# Patient Record
Sex: Male | Born: 2000 | Race: Black or African American | Hispanic: No | Marital: Single | Smoking: Current every day smoker
Health system: Southern US, Community
[De-identification: ages and names within clinical notes are randomized; demographics above are authoritative.]

## PROBLEM LIST (undated history)

## (undated) DIAGNOSIS — W3400XA Accidental discharge from unspecified firearms or gun, initial encounter: Secondary | ICD-10-CM

---
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
## 2019-02-24 ENCOUNTER — Other Ambulatory Visit: Payer: Self-pay

## 2019-02-24 ENCOUNTER — Emergency Department
Admission: EM | Admit: 2019-02-24 | Discharge: 2019-02-25 | Disposition: A | Discharge status: Home / Self Care | Payer: Medicaid Other | Attending: Emergency Medicine | Admitting: Emergency Medicine

## 2019-02-24 ENCOUNTER — Emergency Department: Payer: Medicaid Other

## 2019-02-24 DIAGNOSIS — R519 Headache, unspecified: Secondary | ICD-10-CM | POA: Insufficient documentation

## 2019-02-24 DIAGNOSIS — S60512A Abrasion of left hand, initial encounter: Secondary | ICD-10-CM | POA: Diagnosis not present

## 2019-02-24 DIAGNOSIS — S8001XA Contusion of right knee, initial encounter: Secondary | ICD-10-CM

## 2019-02-24 DIAGNOSIS — Y9389 Activity, other specified: Secondary | ICD-10-CM | POA: Diagnosis not present

## 2019-02-24 DIAGNOSIS — S60222A Contusion of left hand, initial encounter: Secondary | ICD-10-CM

## 2019-02-24 DIAGNOSIS — S80211A Abrasion, right knee, initial encounter: Secondary | ICD-10-CM | POA: Insufficient documentation

## 2019-02-24 DIAGNOSIS — F17210 Nicotine dependence, cigarettes, uncomplicated: Secondary | ICD-10-CM | POA: Diagnosis not present

## 2019-02-24 DIAGNOSIS — T07XXXA Unspecified multiple injuries, initial encounter: Secondary | ICD-10-CM

## 2019-02-24 DIAGNOSIS — M7918 Myalgia, other site: Secondary | ICD-10-CM

## 2019-02-24 DIAGNOSIS — Y92410 Unspecified street and highway as the place of occurrence of the external cause: Secondary | ICD-10-CM | POA: Diagnosis not present

## 2019-02-24 DIAGNOSIS — S8991XA Unspecified injury of right lower leg, initial encounter: Secondary | ICD-10-CM | POA: Diagnosis present

## 2019-02-24 DIAGNOSIS — Y999 Unspecified external cause status: Secondary | ICD-10-CM | POA: Insufficient documentation

## 2019-02-24 MED ORDER — BACITRACIN ZINC 500 UNIT/GM EX OINT
TOPICAL_OINTMENT | Freq: Two times a day (BID) | CUTANEOUS | Status: DC
  Administered 2019-02-25: 1 via TOPICAL
  Filled 2019-02-24: qty 0.9
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
## 2019-02-24 NOTE — ED Notes (Signed)
Report given to Rebecca RN.

## 2019-02-24 NOTE — Discharge Instructions (Addendum)
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
## 2022-06-16 ENCOUNTER — Other Ambulatory Visit: Payer: Self-pay

## 2022-06-16 ENCOUNTER — Encounter (HOSPITAL_COMMUNITY): Payer: Self-pay | Admitting: Emergency Medicine

## 2022-06-16 ENCOUNTER — Emergency Department (HOSPITAL_COMMUNITY): Payer: Medicaid Other

## 2022-06-16 ENCOUNTER — Emergency Department (HOSPITAL_COMMUNITY)
Admission: EM | Admit: 2022-06-16 | Discharge: 2022-06-16 | Disposition: A | Discharge status: Home / Self Care | Payer: Medicaid Other | Attending: Emergency Medicine | Admitting: Emergency Medicine

## 2022-06-16 DIAGNOSIS — M25552 Pain in left hip: Secondary | ICD-10-CM | POA: Diagnosis present

## 2022-06-16 DIAGNOSIS — S71002A Unspecified open wound, left hip, initial encounter: Secondary | ICD-10-CM | POA: Insufficient documentation

## 2022-06-16 DIAGNOSIS — Z23 Encounter for immunization: Secondary | ICD-10-CM | POA: Diagnosis not present

## 2022-06-16 DIAGNOSIS — W3400XA Accidental discharge from unspecified firearms or gun, initial encounter: Secondary | ICD-10-CM | POA: Insufficient documentation

## 2022-06-16 LAB — I-STAT CHEM 8, ED
BUN: 18 mg/dL (ref 6–20)
Calcium, Ion: 1.14 mmol/L — ABNORMAL LOW (ref 1.15–1.40)
Chloride: 109 mmol/L (ref 98–111)
Creatinine, Ser: 1.2 mg/dL (ref 0.61–1.24)
Glucose, Bld: 156 mg/dL — ABNORMAL HIGH (ref 70–99)
HCT: 41 % (ref 39.0–52.0)
Hemoglobin: 13.9 g/dL (ref 13.0–17.0)
Potassium: 3.3 mmol/L — ABNORMAL LOW (ref 3.5–5.1)
Sodium: 144 mmol/L (ref 135–145)
TCO2: 20 mmol/L — ABNORMAL LOW (ref 22–32)

## 2022-06-16 LAB — CBC
HCT: 40.9 % (ref 39.0–52.0)
Hemoglobin: 13.5 g/dL (ref 13.0–17.0)
MCH: 28.7 pg (ref 26.0–34.0)
MCHC: 33 g/dL (ref 30.0–36.0)
MCV: 86.8 fL (ref 80.0–100.0)
Platelets: 196 10*3/uL (ref 150–400)
RBC: 4.71 MIL/uL (ref 4.22–5.81)
RDW: 12.4 % (ref 11.5–15.5)
WBC: 9 10*3/uL (ref 4.0–10.5)
nRBC: 0 % (ref 0.0–0.2)

## 2022-06-16 LAB — PROTIME-INR
INR: 1 (ref 0.8–1.2)
Prothrombin Time: 12.8 seconds (ref 11.4–15.2)

## 2022-06-16 LAB — COMPREHENSIVE METABOLIC PANEL
ALT: 18 U/L (ref 0–44)
AST: 28 U/L (ref 15–41)
Albumin: 4.1 g/dL (ref 3.5–5.0)
Alkaline Phosphatase: 66 U/L (ref 38–126)
Anion gap: 14 (ref 5–15)
BUN: 18 mg/dL (ref 6–20)
CO2: 19 mmol/L — ABNORMAL LOW (ref 22–32)
Calcium: 8.9 mg/dL (ref 8.9–10.3)
Chloride: 108 mmol/L (ref 98–111)
Creatinine, Ser: 1.14 mg/dL (ref 0.61–1.24)
GFR, Estimated: >60 mL/min (ref >60)
Glucose, Bld: 157 mg/dL — ABNORMAL HIGH (ref 70–99)
Potassium: 3.4 mmol/L — ABNORMAL LOW (ref 3.5–5.1)
Sodium: 141 mmol/L (ref 135–145)
Total Bilirubin: 0.8 mg/dL (ref 0.3–1.2)
Total Protein: 7.2 g/dL (ref 6.5–8.1)

## 2022-06-16 LAB — ETHANOL: Alcohol, Ethyl (B): 65 mg/dL — ABNORMAL HIGH (ref <10)

## 2022-06-16 MED ORDER — OXYCODONE-ACETAMINOPHEN 5-325 MG PO TABS: 1.0000 | ORAL_TABLET | Freq: Four times a day (QID) | ORAL | 0 refills | Status: DC | PRN

## 2022-06-16 MED ORDER — IOHEXOL 350 MG/ML SOLN
75.0000 mL | Freq: Once | INTRAVENOUS | Status: AC | PRN
  Administered 2022-06-16: 75 mL via INTRAVENOUS

## 2022-06-16 MED ORDER — TETANUS-DIPHTH-ACELL PERTUSSIS 5-2.5-18.5 LF-MCG/0.5 IM SUSY
0.5000 mL | PREFILLED_SYRINGE | Freq: Once | INTRAMUSCULAR | Status: AC
  Administered 2022-06-16: 0.5 mL via INTRAMUSCULAR
  Filled 2022-06-16: qty 0.5

## 2022-06-16 MED ORDER — CEPHALEXIN 500 MG PO CAPS: 500.0000 mg | ORAL_CAPSULE | Freq: Four times a day (QID) | ORAL | 0 refills | Status: DC

## 2022-06-16 MED ORDER — OXYCODONE-ACETAMINOPHEN 5-325 MG PO TABS
1.0000 | ORAL_TABLET | Freq: Once | ORAL | Status: AC
  Administered 2022-06-16: 1 via ORAL
  Filled 2022-06-16: qty 1

## 2022-06-16 MED ORDER — CEFAZOLIN SODIUM-DEXTROSE 1-4 GM/50ML-% IV SOLN
1.0000 g | Freq: Once | INTRAVENOUS | Status: AC
  Administered 2022-06-16: 1 g via INTRAVENOUS
  Filled 2022-06-16: qty 50

## 2022-06-16 MED ORDER — FENTANYL CITRATE PF 50 MCG/ML IJ SOSY
50.0000 ug | PREFILLED_SYRINGE | Freq: Once | INTRAMUSCULAR | Status: AC
  Administered 2022-06-16: 50 ug via INTRAVENOUS
  Filled 2022-06-16: qty 1

## 2022-06-16 NOTE — ED Notes (Signed)
Pt was helped with attempted ambulation, was unable to bear weight on the left leg due to pain, pt stated he felt a shooting pain down to his knee". MD was made aware

## 2022-06-16 NOTE — ED Notes (Signed)
[place] [employer] at bedside

## 2022-06-16 NOTE — ED Notes (Addendum)
MD Ralene Bathe at bedside. TRN Autumn at bedside.
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
## 2022-06-16 NOTE — Discharge Instructions (Signed)
Use the crutches for weightbearing as tolerated.  Please call the orthopedics office for a follow-up appointment.  You may take ibuprofen, available over-the-counter according to label instructions for pain.

## 2022-06-16 NOTE — Progress Notes (Signed)
   06/16/22 0200  Spiritual Encounters  Type of Visit Initial  Care provided to: Patient  Referral source Trauma page  Reason for visit Trauma  OnCall Visit Yes  Spiritual Framework  Presenting Themes Impactful experiences and emotions  Interventions  Spiritual Care Interventions Made Established relationship of care and support;Encouragement  Intervention Outcomes  Outcomes Connection to spiritual care;Declined chaplain visit   Chaplain responded to Trauma Code, pt was alert but declined chaplain visit. He had an optimistic outlook and stated he was okay.

## 2022-06-16 NOTE — ED Notes (Signed)
Pt voided 316m of urine, with no red or blood tinged color noted Was given a ginger ale, and ice water
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
## 2022-06-16 NOTE — Progress Notes (Signed)
Orthopedic Tech Progress Note Patient Details:  Frank Miller 2001-04-04 [id number]  Ortho Devices Type of Ortho Device: Crutches Ortho Device/Splint Interventions: Ordered, Application, Adjustment   Post Interventions Patient Tolerated: Well Instructions Provided: Adjustment of device, Care of device, Poper ambulation with device  Sreekar Broyhill L Angelisa Winthrop 06/16/2022, 6:39 AM

## 2022-06-16 NOTE — ED Notes (Signed)
X-ray at bedside
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
## 2022-06-16 NOTE — ED Notes (Signed)
Girlfriend asked to be called with an update on whether or not patient will be staying in the hospital, Girlfriend is [place], [phone]

## 2022-09-18 ENCOUNTER — Encounter (HOSPITAL_COMMUNITY): Payer: Self-pay | Admitting: *Deleted

## 2022-09-18 ENCOUNTER — Ambulatory Visit (HOSPITAL_COMMUNITY)
Admission: EM | Admit: 2022-09-18 | Discharge: 2022-09-18 | Disposition: A | Discharge status: Home / Self Care | Payer: BLUE CROSS/BLUE SHIELD | Attending: Physician Assistant | Admitting: Physician Assistant

## 2022-09-18 DIAGNOSIS — R0981 Nasal congestion: Secondary | ICD-10-CM | POA: Diagnosis present

## 2022-09-18 DIAGNOSIS — J029 Acute pharyngitis, unspecified: Secondary | ICD-10-CM | POA: Diagnosis present

## 2022-09-18 DIAGNOSIS — Z1152 Encounter for screening for COVID-19: Secondary | ICD-10-CM | POA: Insufficient documentation

## 2022-09-18 DIAGNOSIS — J069 Acute upper respiratory infection, unspecified: Secondary | ICD-10-CM | POA: Diagnosis present

## 2022-09-18 HISTORY — DX: Accidental discharge from unspecified firearms or gun, initial encounter: W34.00XA

## 2022-09-18 LAB — POCT RAPID STREP A (OFFICE): Rapid Strep A Screen: NEGATIVE

## 2022-09-18 MED ORDER — FLUTICASONE PROPIONATE 50 MCG/ACT NA SUSP: 1.0000 | Freq: Every day | NASAL | 0 refills | Status: AC

## 2022-09-18 MED ORDER — IBUPROFEN 600 MG PO TABS: 600.0000 mg | ORAL_TABLET | Freq: Four times a day (QID) | ORAL | 0 refills | Status: AC | PRN

## 2022-09-18 MED ORDER — LIDOCAINE VISCOUS HCL 2 % MT SOLN: 15.0000 mL | Freq: Four times a day (QID) | OROMUCOSAL | 0 refills | Status: AC | PRN
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
## 2022-09-18 NOTE — Discharge Instructions (Signed)
Your strep test was negative.  We will contact you if you are positive for COVID.  Please monitor your MyChart for these results.  Take ibuprofen 600 mg up to every 6 hours as needed for pain and fever.  Do not take additional NSAIDs with this medication including aspirin, ibuprofen/Advil, naproxen/Aleve.  You can use acetaminophen/Tylenol for breakthrough pain.  Use fluticasone to help with your nasal congestion.  I also recommend nasal saline and sinus rinses.  Gargle with warm salt water for sore throat.  You can use viscous lidocaine.  Gargle and spit this out up to every 6 hours.  Do not eat or drink immediately after using this medication as it can increase your risk for choking.  If your symptoms or not improving within a week please return for reevaluation.  If you have any worsening symptoms including difficulty swallowing, swelling of your throat, shortness of breath, high fever not responding to medication, chest pain, nausea/vomiting interfering with oral intake you need to be seen immediately.

## 2022-09-18 NOTE — ED Triage Notes (Signed)
Pt states he has cough, congestion, headache, chills, sore throat since yesterday. He states he is taking tylenol as needed. He states thatinside of his neck hurts when he moves or bends neck.

## 2022-09-19 LAB — SARS CORONAVIRUS 2 (TAT 6-24 HRS): SARS Coronavirus 2: NEGATIVE

## 2022-09-20 LAB — CULTURE, GROUP A STREP (THRC)
